# Patient Record
Sex: Female | Born: 1970 | Race: White | Hispanic: No | Smoking: Current every day smoker
Health system: Southern US, Community
[De-identification: ages and names within clinical notes are randomized; demographics above are authoritative.]

## PROBLEM LIST (undated history)

## (undated) DIAGNOSIS — F410 Panic disorder [episodic paroxysmal anxiety] without agoraphobia: Secondary | ICD-10-CM

## (undated) HISTORY — PX: CHOLECYSTECTOMY: SHX55

---

## 2002-01-30 ENCOUNTER — Emergency Department (HOSPITAL_COMMUNITY): Admission: EM | Admit: 2002-01-30 | Discharge: 2002-01-30 | Payer: Self-pay | Admitting: Emergency Medicine

## 2005-10-29 ENCOUNTER — Other Ambulatory Visit: Admission: RE | Admit: 2005-10-29 | Discharge: 2005-10-29 | Payer: Self-pay | Admitting: Obstetrics and Gynecology

## 2006-01-28 ENCOUNTER — Ambulatory Visit (HOSPITAL_COMMUNITY): Admission: RE | Admit: 2006-01-28 | Discharge: 2006-01-28 | Payer: Self-pay | Admitting: Urology

## 2006-07-26 ENCOUNTER — Encounter (INDEPENDENT_AMBULATORY_CARE_PROVIDER_SITE_OTHER): Payer: Self-pay | Admitting: Obstetrics and Gynecology

## 2006-07-26 ENCOUNTER — Ambulatory Visit (HOSPITAL_COMMUNITY): Admission: RE | Admit: 2006-07-26 | Discharge: 2006-07-26 | Payer: Self-pay | Admitting: Obstetrics and Gynecology

## 2007-03-13 ENCOUNTER — Ambulatory Visit (HOSPITAL_COMMUNITY): Payer: Self-pay | Admitting: Marriage and Family Therapist

## 2007-03-26 ENCOUNTER — Ambulatory Visit (HOSPITAL_COMMUNITY): Payer: Self-pay | Admitting: Marriage and Family Therapist

## 2008-05-14 IMAGING — CT CT ABD-PELV W/O CM
3 of 8 series · 11 of 42 positions shown, 17 images · IV contrast (CONTRAST)
Comparison: CT of the abdomen and pelvis done 08/01/05.

PATIENT NAME:  TIGER, EBADAT
DOB:  [birth year]MRN:  [id number]    

REFERRING PHYSICIAN:  DR MMLOTSANE KENAAPE
READ BY:  [REDACTED]al Data: Low back pain radiating to the right hip for 6-8 weeks.  History of breast cancer.  
MRI LUMBAR SPINE WITHOUT CONTRAST:
TECHNIQUE: Multiplanar and multiecho pulse sequences of the lumbar spine, to include the lower thoracic and upper sacral regions, were obtained according to standard protocol without IV contrast.

[Series 4: venous · axial · portal-venous · 0.68mm/px · z∈[+1110,+1350]mm · 4 of 80 slices shown, 9 images]
[im 16/80  soft-tissue]
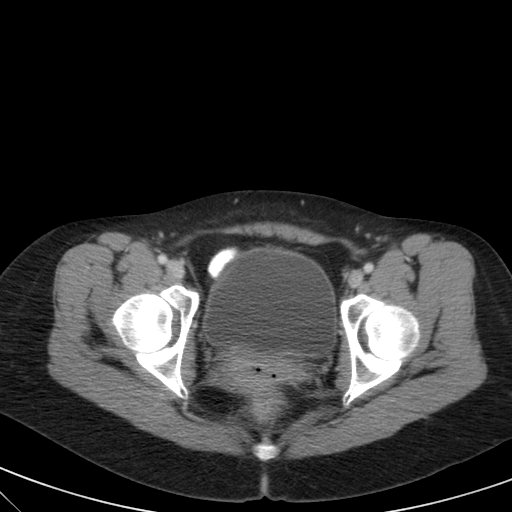
[im 16/80  lung]
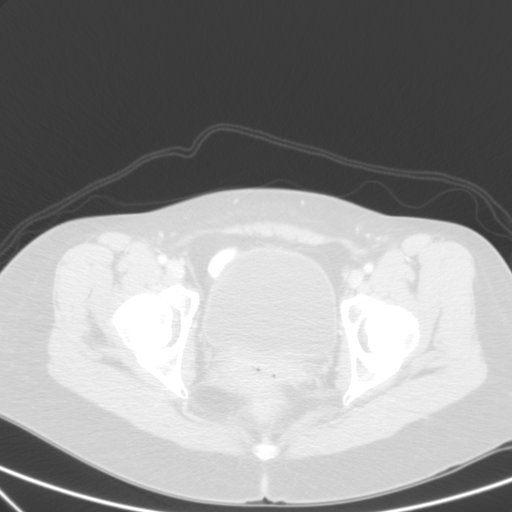
[im 16/80  bone]
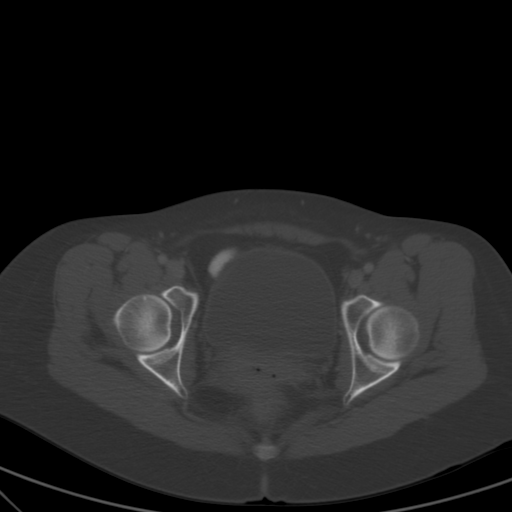
[im 32/80  soft-tissue]
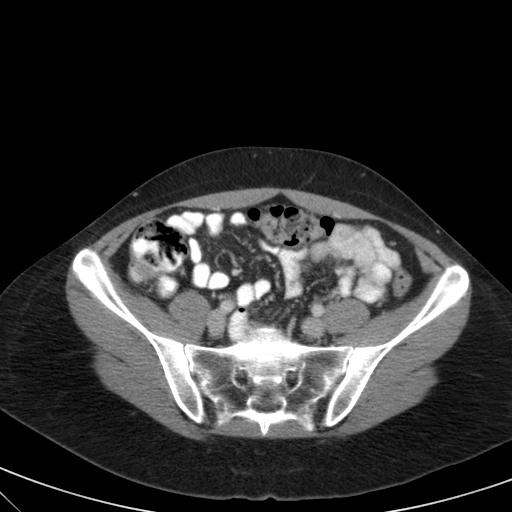
[im 32/80  lung]
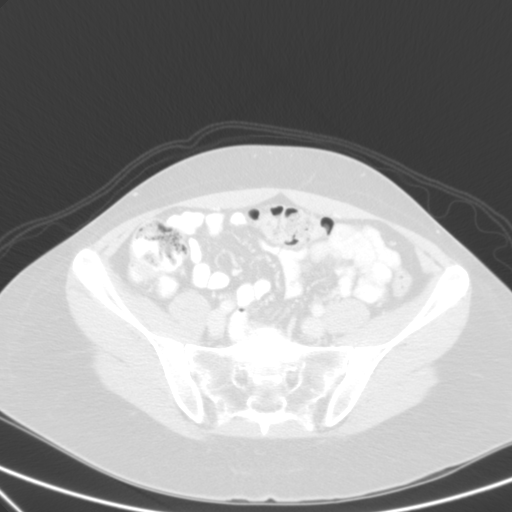
[im 48/80  soft-tissue]
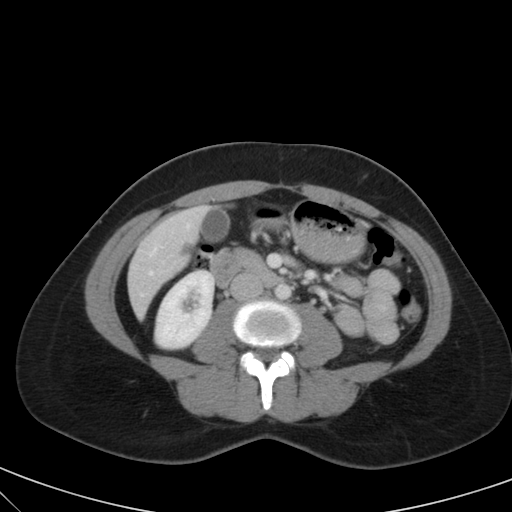
[im 48/80  lung]
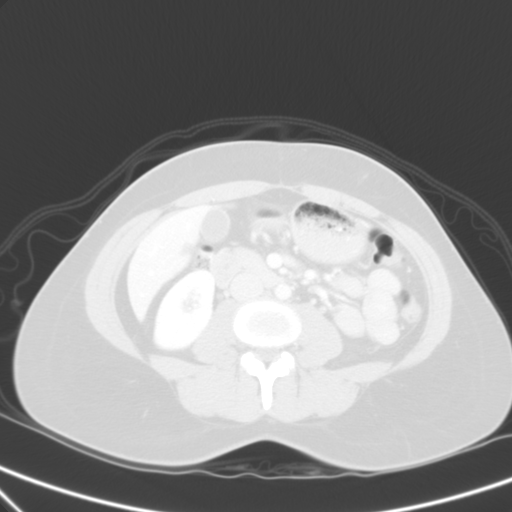
[im 64/80  soft-tissue]
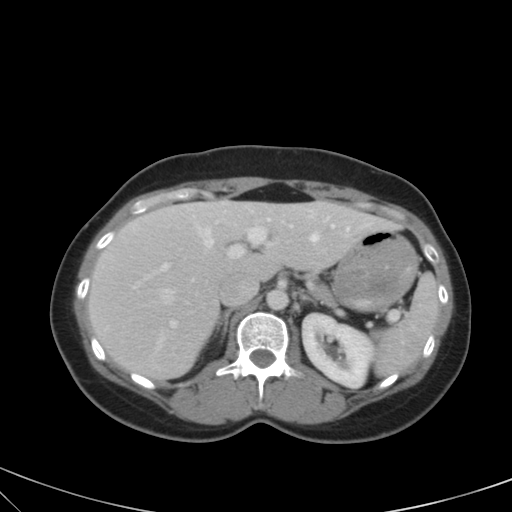
[im 64/80  lung]
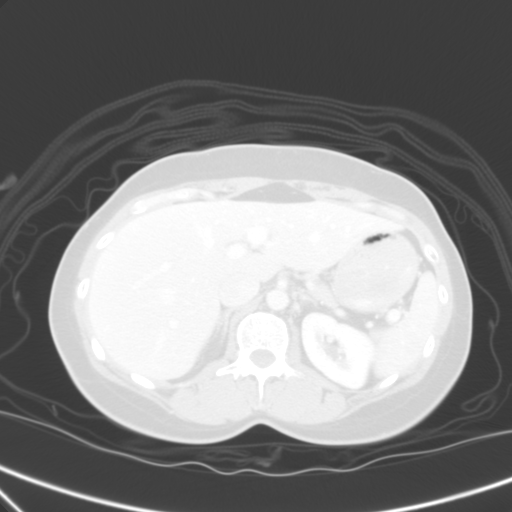

[Series 9: bladder delays · axial · 0.68mm/px · z∈[+1109,+1344]mm · 4 of 79 slices shown]
[im 16/79  soft-tissue]
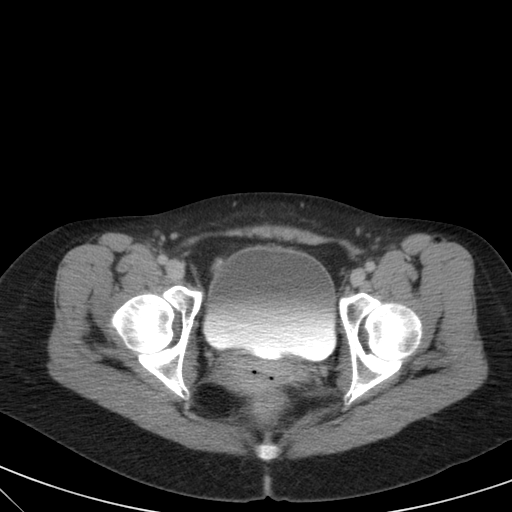
[im 32/79  soft-tissue]
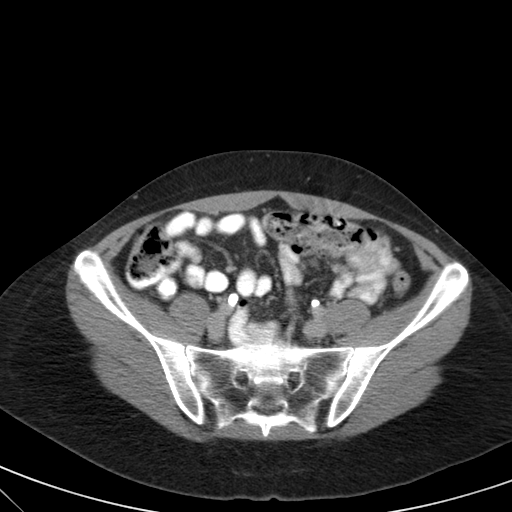
[im 47/79  soft-tissue]
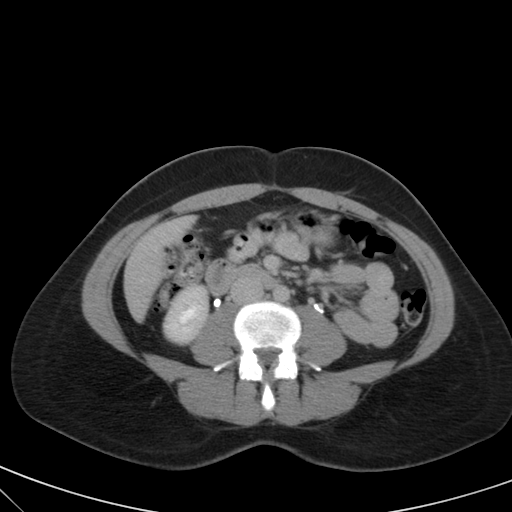
[im 63/79  soft-tissue]
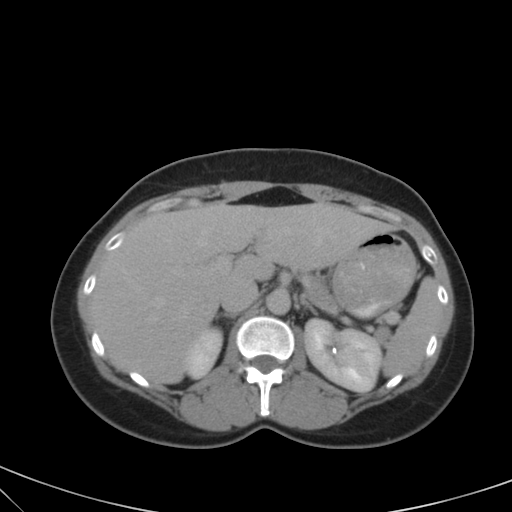

[coronals · coronal · 0.77mm/px · 3 of 54 slices shown, 4 images]
[im 18/54  soft-tissue]
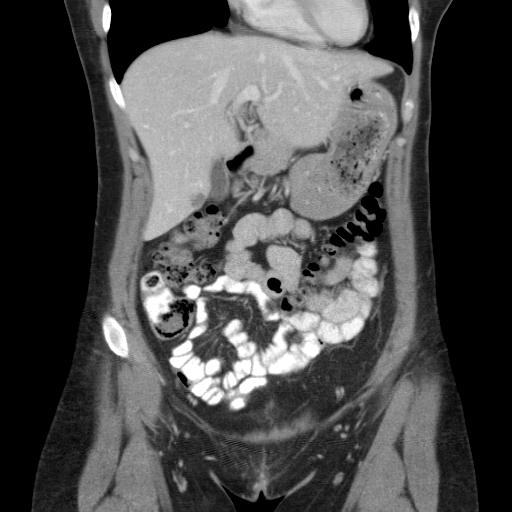
[im 24/54  soft-tissue]
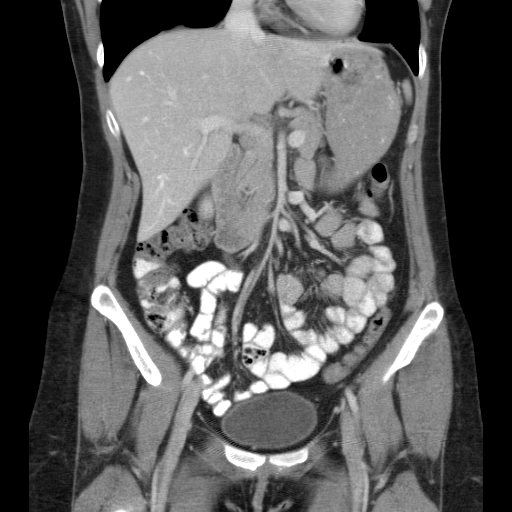
[im 24/54  bone]
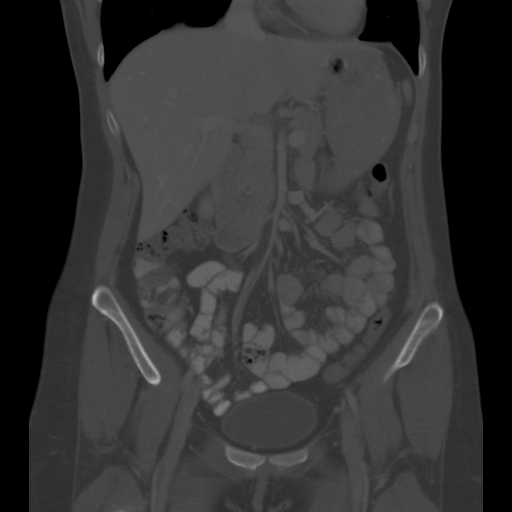
[im 30/54  soft-tissue]
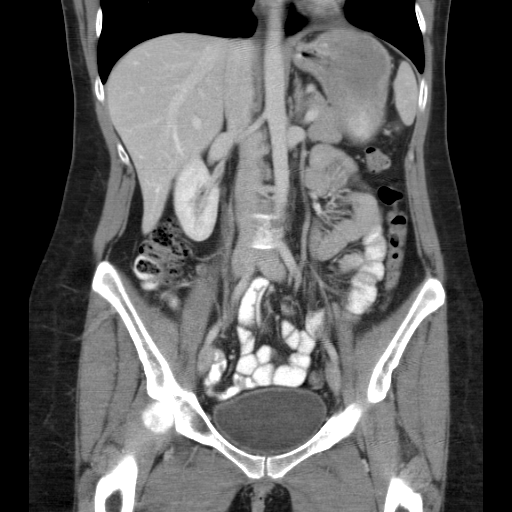

[11 of 42 positions shown; findings below may reference images not displayed]

FINDINGS: Five lumbar type vertebral bodies demonstrate a convex left scoliosis.  The sagittal alignment is anatomic.  There is no evidence of fracture or metastatic disease.  The conus medullaris appears normal, extending to the upper L1 level.

No paraspinal abnormalities are demonstrated.  Sagittal imaging demonstrates no disk space findings from T11-12 through L1-2.

L2-3:  Annular disk bulging and mild bilateral facet hypertrophy.  No spinal stenosis or nerve root encroachment.

L3-4:  Mild disk bulge and mild bilateral facet hypertrophy.  No spinal stenosis or nerve root encroachment.  

L4-5:  Annular disk bulging and bilateral facet hypertrophy contribute to mild right foraminal and left greater than right lateral recess stenosis.  Left L5 nerve root encroachment is possible.  No right sided nerve root compression is seen.

L5-S1:  A left paracentral disk extrusion demonstrates cranial extension, but is largely contained within the ventral epidural fat.  No mass effect on the L5 or S1 nerve roots is demonstrated.  There is asymmetric left sided facet hypertrophy, but no significant foraminal compromise.
                 CONTINUED ON PAGE TWO... WBV/[REDACTED]

PATIENT NAME:  VINNY GULUVA
DOB:  [birth year]MRN:  [id number]    

PAGE TWO?
IMPRESSION: 1.A specific cause for the patient's right hip pain is not demonstrated by this examination.  At L4-5, there is mild right foraminal and right lateral recess stenosis due to annular disk bulging and bilateral facet hypertrophy.  The left lateral recess stenosis appears more significant at that level.
2.Small left paracentral disk extrusion at L5-S1 without demonstrated resulting spinal stenosis or nerve root encroachment.
3.Lesser disk bulging and facet disease at L2-3 and L3-4 without resulting spinal stenosis.

[REDACTED]SCRIBED: 11-12-2005 [DATE]
APPROVED: TIGER, EBADAT 11-12-2005 [DATE]

WBV/[REDACTED]

## 2008-05-23 ENCOUNTER — Emergency Department (HOSPITAL_COMMUNITY): Admission: EM | Admit: 2008-05-23 | Discharge: 2008-05-23 | Payer: Self-pay | Admitting: Emergency Medicine

## 2008-06-01 ENCOUNTER — Ambulatory Visit (HOSPITAL_COMMUNITY): Payer: Self-pay | Admitting: Marriage and Family Therapist

## 2008-06-10 ENCOUNTER — Ambulatory Visit (HOSPITAL_COMMUNITY): Payer: Self-pay | Admitting: Marriage and Family Therapist

## 2008-06-17 ENCOUNTER — Ambulatory Visit (HOSPITAL_COMMUNITY): Payer: Self-pay | Admitting: Marriage and Family Therapist

## 2008-06-22 ENCOUNTER — Ambulatory Visit (HOSPITAL_COMMUNITY): Payer: Self-pay | Admitting: Licensed Clinical Social Worker

## 2010-04-18 LAB — DIFFERENTIAL
Basophils Absolute: 0 10*3/uL (ref 0.0–0.1)
Basophils Relative: 1 % (ref 0–1)
Eosinophils Absolute: 0.1 10*3/uL (ref 0.0–0.7)
Eosinophils Relative: 1 % (ref 0–5)
Lymphocytes Relative: 38 % (ref 12–46)
Lymphs Abs: 2.5 10*3/uL (ref 0.7–4.0)
Monocytes Absolute: 0.6 10*3/uL (ref 0.1–1.0)
Monocytes Relative: 9 % (ref 3–12)

## 2010-04-18 LAB — CBC
Platelets: 284 10*3/uL (ref 150–400)
RDW: 13.1 % (ref 11.5–15.5)

## 2010-04-18 LAB — ETHANOL

## 2010-04-18 LAB — BASIC METABOLIC PANEL
BUN: 7 mg/dL (ref 6–23)
CO2: 27 mEq/L (ref 19–32)
Calcium: 9.1 mg/dL (ref 8.4–10.5)
Chloride: 109 mEq/L (ref 96–112)
Potassium: 3.3 mEq/L — ABNORMAL LOW (ref 3.5–5.1)
Sodium: 141 mEq/L (ref 135–145)

## 2010-04-18 LAB — RAPID URINE DRUG SCREEN, HOSP PERFORMED
Barbiturates: NOT DETECTED
Benzodiazepines: NOT DETECTED
Opiates: NOT DETECTED

## 2010-05-23 NOTE — Op Note (Signed)
NAMETYMIA, STREB                   ACCOUNT NO.:  [id number]   MEDICAL RECORD NO.:  [id number]          PATIENT TYPE:  AMB   LOCATION:  SDC                           FACILITY:  [facility]   PHYSICIAN:  Naima A. Dillard, M.D. DATE OF BIRTH:  May 24, 1970   DATE OF PROCEDURE:  07/26/2006  DATE OF DISCHARGE:                               OPERATIVE REPORT   PREOPERATIVE DIAGNOSIS:  Irregular vaginal bleeding and endometrial  polyps.   POSTOPERATIVE DIAGNOSIS:  Irregular vaginal bleeding and endometrial  polyps.   PROCEDURE:  D&C hysteroscopy, polypectomy.   SURGEON:  Dr. Normand Sloop.  There were no assistants.   ANESTHESIA:  General laryngeal mask airway.   SPECIMENS:  Endometrial polyp and endometrial curettings.   COMPLICATIONS:  None.   There was a 60 mL deficit.  Most of it appeared to be on the floor.  The  patient went to PACU in stable condition.   PROCEDURE IN DETAIL:  The patient was taken to the operating room where  she was given general anesthesia, placed in dorsal lithotomy position  and prepped and draped in a normal sterile fashion.  Exam under  anesthesia revealed the patient to be anteverted, normal size uterus  with no adnexal masses.  A bivalve speculum was placed into the vagina.  The edge of the cervix was grasped with a single-tooth tenaculum.  Twenty mL of 1% lidocaine was used for a cervical block.  Cervix was  dilated with Pratt dilators up to 19, and hysteroscope was placed into  the uterine cavity.  Both ostia were visualized on the side walls;  anterior and posterior side walls of the uterine cavity were visualized.  The patient had one polyp that was posterior in the canal and then just  fluffy endometrium.  The hysteroscope was removed.  Polyp forceps were  placed into the uterine cavity, and most of the polyp was removed.  The  rest was removed with a polyp forceps through the hysteroscope.  A sharp  curette was done.  The hysteroscope was placed back in the  uterine  cavity.  The polyp was removed without difficulty.  Scant endometrial  curettings were obtained, but there was no more fluffy endometrium noted  at the end of the procedure.  Sponge, lap and needle counts were  correct.  She was removed from the vagina.  Sponge, lap and needle  counts were correct.  The tenaculum site was made hemostatic with  pressure and silver nitrate.      Naima A. Normand Sloop, M.D.  Electronically Signed     NAD/MEDQ  D:  07/26/2006  T:  07/27/2006  Job:  [id number]

## 2010-10-23 LAB — CBC
HCT: 39.3
MCV: 92.8
RBC: 4.24
WBC: 7.4

## 2010-10-23 LAB — HCG, SERUM, QUALITATIVE: Preg, Serum: NEGATIVE

## 2014-07-25 ENCOUNTER — Encounter (HOSPITAL_COMMUNITY): Payer: Self-pay

## 2014-07-25 ENCOUNTER — Emergency Department (HOSPITAL_COMMUNITY)
Admission: EM | Admit: 2014-07-25 | Discharge: 2014-07-25 | Disposition: A | Discharge status: Home / Self Care | Payer: Self-pay | Attending: Emergency Medicine | Admitting: Emergency Medicine

## 2014-07-25 DIAGNOSIS — R202 Paresthesia of skin: Secondary | ICD-10-CM | POA: Insufficient documentation

## 2014-07-25 DIAGNOSIS — Z79899 Other long term (current) drug therapy: Secondary | ICD-10-CM | POA: Insufficient documentation

## 2014-07-25 DIAGNOSIS — Z7982 Long term (current) use of aspirin: Secondary | ICD-10-CM | POA: Insufficient documentation

## 2014-07-25 DIAGNOSIS — R51 Headache: Secondary | ICD-10-CM | POA: Insufficient documentation

## 2014-07-25 DIAGNOSIS — F410 Panic disorder [episodic paroxysmal anxiety] without agoraphobia: Secondary | ICD-10-CM | POA: Insufficient documentation

## 2014-07-25 DIAGNOSIS — R0602 Shortness of breath: Secondary | ICD-10-CM | POA: Insufficient documentation

## 2014-07-25 DIAGNOSIS — Z3202 Encounter for pregnancy test, result negative: Secondary | ICD-10-CM | POA: Insufficient documentation

## 2014-07-25 DIAGNOSIS — Z720 Tobacco use: Secondary | ICD-10-CM | POA: Insufficient documentation

## 2014-07-25 HISTORY — DX: Panic disorder (episodic paroxysmal anxiety): F41.0

## 2014-07-25 LAB — DIFFERENTIAL
Basophils Absolute: 0 10*3/uL (ref 0.0–0.1)
Basophils Relative: 1 % (ref 0–1)
EOS ABS: 0.1 10*3/uL (ref 0.0–0.7)
EOS PCT: 1 % (ref 0–5)
LYMPHS ABS: 2.4 10*3/uL (ref 0.7–4.0)
Lymphocytes Relative: 32 % (ref 12–46)
Monocytes Absolute: 0.5 10*3/uL (ref 0.1–1.0)
Monocytes Relative: 6 % (ref 3–12)
Neutro Abs: 4.6 10*3/uL (ref 1.7–7.7)
Neutrophils Relative %: 60 % (ref 43–77)

## 2014-07-25 LAB — I-STAT CHEM 8, ED
BUN: 16 mg/dL (ref 6–20)
CALCIUM ION: 1.11 mmol/L — AB (ref 1.12–1.23)
CHLORIDE: 105 mmol/L (ref 101–111)
CREATININE: 1 mg/dL (ref 0.44–1.00)
Glucose, Bld: 90 mg/dL (ref 65–99)
HCT: 45 % (ref 36.0–46.0)
Hemoglobin: 15.3 g/dL — ABNORMAL HIGH (ref 12.0–15.0)
Potassium: 3.9 mmol/L (ref 3.5–5.1)
SODIUM: 139 mmol/L (ref 135–145)
TCO2: 21 mmol/L (ref 0–100)

## 2014-07-25 LAB — APTT: APTT: 29 s (ref 24–37)

## 2014-07-25 LAB — COMPREHENSIVE METABOLIC PANEL
ALK PHOS: 64 U/L (ref 38–126)
ALT: 15 U/L (ref 14–54)
AST: 20 U/L (ref 15–41)
Albumin: 4.3 g/dL (ref 3.5–5.0)
Anion gap: 11 (ref 5–15)
BUN: 13 mg/dL (ref 6–20)
CO2: 22 mmol/L (ref 22–32)
Calcium: 9.6 mg/dL (ref 8.9–10.3)
Chloride: 104 mmol/L (ref 101–111)
Creatinine, Ser: 0.92 mg/dL (ref 0.44–1.00)
GLUCOSE: 92 mg/dL (ref 65–99)
POTASSIUM: 4 mmol/L (ref 3.5–5.1)
Sodium: 137 mmol/L (ref 135–145)
TOTAL PROTEIN: 7.3 g/dL (ref 6.5–8.1)
Total Bilirubin: 0.5 mg/dL (ref 0.3–1.2)

## 2014-07-25 LAB — URINALYSIS, ROUTINE W REFLEX MICROSCOPIC
Bilirubin Urine: NEGATIVE
GLUCOSE, UA: NEGATIVE mg/dL
KETONES UR: NEGATIVE mg/dL
LEUKOCYTES UA: NEGATIVE
Nitrite: NEGATIVE
PROTEIN: NEGATIVE mg/dL
Specific Gravity, Urine: 1.003 — ABNORMAL LOW (ref 1.005–1.030)
Urobilinogen, UA: 0.2 mg/dL (ref 0.0–1.0)
pH: 6.5 (ref 5.0–8.0)

## 2014-07-25 LAB — CBC
HEMATOCRIT: 40.7 % (ref 36.0–46.0)
HEMOGLOBIN: 13.8 g/dL (ref 12.0–15.0)
MCH: 31 pg (ref 26.0–34.0)
MCHC: 33.9 g/dL (ref 30.0–36.0)
MCV: 91.5 fL (ref 78.0–100.0)
Platelets: 318 10*3/uL (ref 150–400)
RBC: 4.45 MIL/uL (ref 3.87–5.11)
RDW: 12.6 % (ref 11.5–15.5)
WBC: 7.7 10*3/uL (ref 4.0–10.5)

## 2014-07-25 LAB — I-STAT TROPONIN, ED: Troponin i, poc: 0 ng/mL (ref 0.00–0.08)

## 2014-07-25 LAB — PROTIME-INR
INR: 0.99 (ref 0.00–1.49)
PROTHROMBIN TIME: 13.3 s (ref 11.6–15.2)

## 2014-07-25 LAB — URINE MICROSCOPIC-ADD ON: RBC / HPF: NONE SEEN RBC/hpf (ref <3)

## 2014-07-25 LAB — CBG MONITORING, ED: Glucose-Capillary: 89 mg/dL (ref 65–99)

## 2014-07-25 LAB — PREGNANCY, URINE: PREG TEST UR: NEGATIVE

## 2014-07-25 NOTE — ED Provider Notes (Addendum)
Pt seen and evaluated.  Reports anxiety, rue "tingling", and fleeting, mild lt posterior headache after drying her hair.  Shows no cardiac risk factors. No history of migraines. Asymptomatic now. Has had anxiety episodes with tingling in her left arm. Has normal neurological exam. Normal EKG. No cardiac risk. She is  appropriate for expectant management and home discharge.  Rolland PorterMark Sharika Mosquera, MD 07/25/14 1523  Rolland PorterMark Elvi Leventhal, MD 07/26/14 [phone]

## 2014-07-25 NOTE — ED Provider Notes (Signed)
CSN: [id number]     Arrival date & time 07/25/14  1251 History   First MD Initiated Contact with Patient 07/25/14 1319     Chief Complaint  Patient presents with  . Numbness     (Consider location/radiation/quality/duration/timing/severity/associated sxs/prior Treatment) HPI Erica Marsh is a 44 year old female past medical history of panic attacks who presents the ER complaining of tingling sensation in her right arm. Patient reports she was blow drying her hair at approximately 12:30 this afternoon, approximately 2 hours ago. Patient reports a sudden onset of a mild headache along with a tingling sensation diffusely down her arm. Patient reports associated shortness of breath which she states is consistent with an identical to anxiety she has experienced in the past. Patient states since this began her Tinley sensation has slightly decreased and subsided, however still present. Patient also reports an associated heaviness in her right arm. Patient denies any persistent headache, change or blurred vision, dizziness, syncope, neck pain, fever, chest pain, nausea, vomiting, abdominal pain, dysuria, diarrhea.  Past Medical History  Diagnosis Date  . Panic attacks    Past Surgical History  Procedure Laterality Date  . Cholecystectomy     No family history on file. History  Substance Use Topics  . Smoking status: Current Every Day Smoker  . Smokeless tobacco: Not on file  . Alcohol Use: No   OB History    No data available     Review of Systems  Constitutional: Negative for fever.  HENT: Negative for trouble swallowing.   Eyes: Negative for visual disturbance.  Respiratory: Negative for shortness of breath.   Cardiovascular: Negative for chest pain.  Gastrointestinal: Negative for nausea, vomiting and abdominal pain.  Genitourinary: Negative for dysuria.  Musculoskeletal: Negative for neck pain.  Skin: Negative for rash.  Neurological: Negative for dizziness, weakness and numbness.        Paresthesias  Psychiatric/Behavioral: Negative.       Allergies  Review of patient's allergies indicates no known allergies.  Home Medications   Prior to Admission medications   Medication Sig Start Date End Date Taking? Authorizing Provider  Aspirin-Acetaminophen-Caffeine (GOODY HEADACHE PO) Take 1 packet by mouth once.   Yes Historical Provider, MD  buPROPion (WELLBUTRIN XL) 150 MG 24 hr tablet Take 150 mg by mouth daily.   Yes Historical Provider, MD  [facility] WORT PO Take 2 tablets by mouth once.   Yes Historical Provider, MD   BP 98/67 mmHg  Pulse 85  Temp(Src) 98.3 F (36.8 C) (Oral)  Resp 20  SpO2 100%  LMP  Physical Exam  Constitutional: She is oriented to person, place, and time. She appears well-developed and well-nourished. No distress.  HENT:  Head: Normocephalic and atraumatic.  Mouth/Throat: Oropharynx is clear and moist. No oropharyngeal exudate.  Eyes: Right eye exhibits no discharge. Left eye exhibits no discharge. No scleral icterus.  Neck: Normal range of motion.  Cardiovascular: Normal rate, regular rhythm and normal heart sounds.   No murmur heard. Pulmonary/Chest: Effort normal and breath sounds normal. No respiratory distress.  Abdominal: Soft. There is no tenderness.  Musculoskeletal: Normal range of motion. She exhibits no edema or tenderness.  Neurological: She is alert and oriented to person, place, and time. She has normal strength. No cranial nerve deficit or sensory deficit. She displays a negative Romberg sign. Coordination and gait normal. GCS eye subscore is 4. GCS verbal subscore is 5. GCS motor subscore is 6.  Patient fully alert, answering questions appropriately in full, clear  sentences. Cranial nerves II through XII grossly intact. Motor strength 5 out of 5 in all major muscle groups of upper and lower extremities. Distal sensation intact.   Skin: Skin is warm and dry. No rash noted. She is not diaphoretic.  Psychiatric: She has a  normal mood and affect.  Nursing note and vitals reviewed.   ED Course  Procedures (including critical care time) Labs Review Labs Reviewed  URINALYSIS, ROUTINE W REFLEX MICROSCOPIC (NOT AT [facility]) - Abnormal; Notable for the following:    Specific Gravity, Urine 1.003 (*)    Hgb urine dipstick SMALL (*)    All other components within normal limits  I-STAT CHEM 8, ED - Abnormal; Notable for the following:    Calcium, Ion 1.11 (*)    Hemoglobin 15.3 (*)    All other components within normal limits  PROTIME-INR  APTT  CBC  DIFFERENTIAL  COMPREHENSIVE METABOLIC PANEL  PREGNANCY, URINE  URINE MICROSCOPIC-ADD ON  CBG MONITORING, ED  I-STAT TROPOININ, ED    Imaging Review No results found.   EKG Interpretation None      MDM   Final diagnoses:  Paresthesia of right arm    Patient's signs and symptoms of paresthesias in right arm, after putting arm down after drying her hair. The symptoms seem to be positional. There is a component of anxiety as patient is very anxious initially. As patient's anxiety subsides, patient's paresthesias also subsided during her stay here. After workup here which is largely unremarkable, tingling sensation is almost completely subsided. She states she has some mild tingling sensation in her fingertips. Neurologic exam remains benign after multiple examinations. Laboratories unremarkable for acute pathology. Heart score 0. Troponin negative, no concern for ACS. EKG without evidence of injury or ectopy. Patient is afebrile, hemodynamic stable and in no acute distress. Patient is neurologically intact. Patient stable for discharge. Encouraged follow up with PCP, return precautions discussed. Patient verbalizes understanding and agreement of this plan.  BP 98/67 mmHg  Pulse 85  Temp(Src) 98.3 F (36.8 C) (Oral)  Resp 20  SpO2 100%  LMP   Signed,  Ladona Mow, PA-C 4:38 PM  Patient seen and discussed with Dr. Rolland Porter, MD    Ladona Mow,  PA-C 07/25/14 1638  Rolland Porter, MD 07/26/14 2205

## 2014-07-25 NOTE — ED Notes (Signed)
Onset 20 minutes PTA pt was blow drying hair and started having tingling in right arm and hand  Grips equal, smile symmetrical, no gait disturbances.  Pt reports that when she has panic attacks left arm is the one that starts tingling.

## 2014-07-25 NOTE — ED Notes (Signed)
States was drying hair at 12:30-- felt right arm start tingling and heavy. Thought it would go away-- also states has drank 1 cup of coffee, taken a goodie powder, felt "a little weird" thought that would pass. Still having arm numbness.

## 2014-07-25 NOTE — ED Notes (Signed)
pts CBG 89

## 2014-07-25 NOTE — Discharge Instructions (Signed)
Paresthesia °Paresthesia is an abnormal burning or prickling sensation. This sensation is generally felt in the hands, arms, legs, or feet. However, it may occur in any part of the body. It is usually not painful. The feeling may be described as: °· Tingling or numbness. °· "Pins and needles." °· Skin crawling. °· Buzzing. °· Limbs "falling asleep." °· Itching. °Most people experience temporary (transient) paresthesia at some time in their lives. °CAUSES  °Paresthesia may occur when you breathe too quickly (hyperventilation). It can also occur without any apparent cause. Commonly, paresthesia occurs when pressure is placed on a nerve. The feeling quickly goes away once the pressure is removed. For some people, however, paresthesia is a long-lasting (chronic) condition caused by an underlying disorder. The underlying disorder may be: °· A traumatic, direct injury to nerves. Examples include a: °¨ Broken (fractured) neck. °¨ Fractured skull. °· A disorder affecting the brain and spinal cord (central nervous system). Examples include: °¨ Transverse myelitis. °¨ Encephalitis. °¨ Transient ischemic attack. °¨ Multiple sclerosis. °¨ Stroke. °¨ Tumor or blood vessel problems, such as an arteriovenous malformation pressing against the brain or spinal cord. °· A condition that damages the peripheral nerves (peripheral neuropathy). Peripheral nerves are not part of the brain and spinal cord. These conditions include: °¨ Diabetes. °¨ Peripheral vascular disease. °¨ Nerve entrapment syndromes, such as carpal tunnel syndrome. °¨ Shingles. °¨ Hypothyroidism. °¨ Vitamin B12 deficiencies. °¨ Alcoholism. °¨ Heavy metal poisoning (lead, arsenic). °¨ Rheumatoid arthritis. °¨ Systemic lupus erythematosus. °DIAGNOSIS  °Your caregiver will attempt to find the underlying cause of your paresthesia. Your caregiver may: °· Take your medical history. °· Perform a physical exam. °· Order various lab tests. °· Order imaging tests. °TREATMENT    °Treatment for paresthesia depends on the underlying cause. °HOME CARE INSTRUCTIONS °· Avoid drinking alcohol. °· You may consider massage or acupuncture to help relieve your symptoms. °· Keep all follow-up appointments as directed by your caregiver. °SEEK IMMEDIATE MEDICAL CARE IF:  °· You feel weak. °· You have trouble walking or moving. °· You have problems with speech or vision. °· You feel confused. °· You cannot control your bladder or bowel movements. °· You feel numbness after an injury. °· You faint. °· Your burning or prickling feeling gets worse when walking. °· You have pain, cramps, or dizziness. °· You develop a rash. °MAKE SURE YOU: °· Understand these instructions. °· Will watch your condition. °· Will get help right away if you are not doing well or get worse. °Document Released: 12/15/2001 Document Revised: 03/19/2011 Document Reviewed: 09/15/2010 °ExitCare® Patient Information ©2015 [employer]. This information is not intended to replace advice given to you by your health care provider. Make sure you discuss any questions you have with your health care provider. ° ° °Emergency Department Resource Guide °1) Find a Doctor and Pay Out of Pocket °Although you won't have to find out who is covered by your insurance plan, it is a good idea to ask around and get recommendations. You will then need to call the office and see if the doctor you have chosen will accept you as a new patient and what types of options they offer for patients who are self-pay. Some doctors offer discounts or will set up payment plans for their patients who do not have insurance, but you will need to ask so you aren't surprised when you get to your appointment. ° °2) Contact Your Local Health Department °Not all health departments have doctors that can see   patients for sick visits, but many do, so it is worth a call to see if yours does. If you don't know where your local health department is, you can check in your phone  book. The CDC also has a tool to help you locate your state's health department, and many state websites also have listings of all of their local health departments. ° °3) Find a Walk-in Clinic °If your illness is not likely to be very severe or complicated, you may want to try a walk in clinic. These are popping up all over the country in pharmacies, drugstores, and shopping centers. They're usually staffed by nurse practitioners or physician assistants that have been trained to treat common illnesses and complaints. They're usually fairly quick and inexpensive. However, if you have serious medical issues or chronic medical problems, these are probably not your best option. ° °No Primary Care Doctor: °- Call [facility] at  832-8000 - they can help you locate a primary care doctor that  accepts your insurance, provides certain services, etc. °- Physician Referral Service- 1-800-533-3463 ° °Chronic Pain Problems: °Organization         Address  Phone   Notes  °[facility]  (336) 297-2271 Patients need to be referred by their primary care doctor.  ° °Medication Assistance: °Organization         Address  Phone   Notes  °[place] [facility] [address] °[place], [place] [address] (336) 641-8030 --Must be a resident of [place] °-- Must have NO insurance coverage whatsoever (no Medicaid/ Medicare, etc.) °-- The pt. MUST have a primary care doctor that directs their care regularly and follows them in the community °  °[facility]  (866) 331-1348   °[employer]  (888) 892-1162   ° °Agencies that provide inexpensive medical care: °Organization         Address  Phone   Notes  °[facility]  (336) 832-8035   °[facility]    (336) 832-7272   °[facility] [address] °[place], [place] [address] (336) 832-4777   °[facility] [address], °[place] (336) 271-4999   °[facility]    (336) 373-0678   °[facility]    (336) 272-1050   °[facility] ° [address], [place] Phone:  (336) 832-4444, Fax:  (336) 832-4440 Hours of Operation:  9 am - 6 pm, M-F.  Also accepts Medicaid/Medicare and self-pay.  °[facility] ° [address], [place] Phone: (336) 832-3150, Fax: (336) 832-3151. Hours of Operation:  8:30 am - 5:30 pm, M-F.  Also accepts Medicaid and self-pay.  °[facility] [place] [address], [place] Phone: (336) 878-6027   °[facility] [address], [place], [place] (336)723-1848, Ext. 123 Mondays & Thursdays: 7-9 AM.  First 15 patients are seen on a first come, first serve basis. °  ° °Medicaid-accepting [place] Providers: ° °Organization         Address  Phone   Notes  °[facility] [address], [place] (336) 641-2100 Also accepts self-pay patients.  °[facility] [address], [place] ° (336) 856-9996   °[facility] [address],  (336) 288-8857   °[facility] [address] [place] [address],  (  336) 299-7000   °Veita Bland [address], [place]  ° (336) 373-1557 Only accepts [place] Access Medicaid patients after they have their name applied to their card.  ° °Self-Pay (no insurance) in [place]: ° °Organization         Address  Phone   Notes  °Sickle Cell Patients, [facility] [address], [place] (336) 832-1970   °[facility] [address], [place] (336) 832-4400   °[facility] ° [address], [place] (336) 992-4800   °[facility]/Dr. Osei-Bonsu ° [address] [place] [address], [place] or [address], [place] (336) 841-8500 Phone number for both [place] and [place] locations is the same.   °[facility] [address], [place] (336) 299-0000   °[facility] [address] [place] [address], [place] or [address] (336) 852-7530 °(336) 878-2260   °[facility] [address], [place] (336) 350-1642, phone; (336) 294-5005, fax Sees patients 1st and 3rd Saturday of every month.  Must not qualify for public or private insurance (i.e. Medicaid, Medicare, [place] Health Choice, Veterans' Benefits) • Household income should be no more than 200% of the poverty level •The clinic cannot treat you if you are pregnant or think you are pregnant • Sexually transmitted diseases are not treated at the clinic.  ° ° °Dental Care: °Organization         Address  Phone  Notes  °[facility] [address], [place] (336) 641-6152 Accepts children up to age 21 who are enrolled in Medicaid or [place] Health Choice; pregnant women with a Medicaid card; and children who have applied for Medicaid or [place] Health Choice, but were declined, whose parents can pay a reduced fee at time of service.  °[facility] [place]  [address], [place] (336) 641-7733 Accepts children up to age 21 who are enrolled in Medicaid or [place] Health Choice; pregnant women with a Medicaid card; and children who have applied for Medicaid or  Health Choice, but were declined, whose parents can pay a reduced fee at time of service.  °[facility] ° [address], [place] (336) 641-4533 Patients are seen by appointment only. Walk-ins are not accepted. [facility] will see patients 18 years of age and older. °Monday - Tuesday (8am-5pm) °Most Wednesdays (8:30-5pm) °$30 per visit, cash only  °[facility] ° [address], [place] (336) 641-4533 Patients are seen by appointment only. Walk-ins are not accepted. [facility] will see patients 18  years of age and older. °One Wednesday Evening (Monthly: Volunteer Based).  $30 per visit, cash only  °[facility]  (919) 537-3737 for adults; Children under age 4, call Graduate Pediatric Dentistry at (919) [phone]. Children aged 4-14, please call (919) [phone] to request a pediatric application. ° Dental services are provided in all areas of dental care including fillings, crowns and bridges, complete and partial dentures, implants, gum treatment, root canals, and extractions. Preventive care is also provided. Treatment is provided to both adults and children. °Patients are selected via a lottery and there is often a waiting list. °  °[facility] [address], °[place] ° (336) 763-8833 [email or web address] °  °[facility] [address], [place], [place] (336)723-1848, Ext. 123 Second and Fourth Thursday of each month, opens at 6:30 AM; Clinic ends at 9 AM.  Patients are seen on a first-come first-served basis, and a limited number are seen during each clinic.  ° °[facility] ° [address], [place], [place] (336) 723-7904   Eligibility Requirements °You must have lived in [place], [place], or [place] counties for at least the last three months. °  You cannot be eligible for state or federal sponsored healthcare insurance, including Veterans Administration, Medicaid, or Medicare. °  You generally cannot be eligible for healthcare insurance through your employer.  °  How to apply: °Eligibility screenings are held every Tuesday and Wednesday afternoon from 1:00 pm until 4:00 pm. You do not need an appointment for the interview!  °[facility] [address], [place], [place] 336-631-2330   °[facility]  336-342-8273   °[facility]  336-703-3100   °[facility]  336-570-6415   ° °Behavioral Health Resources in the Community: °Intensive Outpatient  Programs °Organization         Address  Phone  Notes  °[facility] [address], [place], [place] 336-878-6098   °[facility] [address], [place], [place] 336-832-9800   °[facility] [address], [place], [place] ° 336-882-2125   °[facility] [address],  °[place], [place] 1-800-853-5163 or 336-641-4981   °Substance Abuse Resources °Organization         Address  Phone  Notes  °[facility]  336-882-2125   °[facility]  336-784-9470   °[facility]  336-285-9073   °[facility]  336-845-3988   °Residential & Outpatient Substance Abuse Program  1-800-659-3381   °Psychological Services °Organization         Address  Phone  Notes  °[facility]  336- 832-9600   °[facility]  336- 378-7881   °[facility] [address], [place] 1-800-853-5163 or 336-641-4981   ° °Mobile Crisis Teams °Organization         Address  Phone  Notes  °[facility], Mobile Crisis Care Unit  1-877-626-1772   °Assertive °[facility] ° [address] [place], [place] 336-834-9664   °Sharon DeEsch [address] °[place] 336-554-5454   ° °Self-Help/Support Groups °Organization         Address  Phone             Notes  °[facility] - variety of support groups  336- 373-1402 Call for more information  °Narcotics Anonymous (NA), [facility] [address], °[place]  2 meetings at this location  ° °Residential Treatment Programs °Organization         Address  Phone  Notes  °[facility] [address],    °[place]  1-866-801-8205   °[facility] ° [address], [place], [place] 704-293-8524   °[facility] [address], [place] 336-845-3988 Admissions: 8am-3pm M-F  °[facility] [address]    °[place], [place]  336-841-1104   °[facility] [address], [place], [place] 336-379-7146   °[facility] [address]  °[place], [place] 336-285-9073   °[facility] - Intensive Outpatient [address], ,  336-852-3033   °  [facility] [address]  °[place], [place] 1-877-615-2722 or 336-784-9470   °[facility] ([facility] [address] [place], [place] 336-227-7417 Accepts Medicaid  °[facility] [address]  °[place] 1-800-659-3381 Substance Abuse/Addiction Treatment  ° °[place] Behavioral Health Resources °Organization         Address  Phone  Notes  °[facility]  (888) 581-9988   °Julie Brannon, PhD [address] [place], [place]   (336) 349-5553 or (336) 951-0000   °[facility]   [address] °[place], [place] (336) 349-4454   °[facility] [address], [place], [place] (336) 342-8316 Insurance/Medicaid/sponsorship through [facility]  °[facility] [address]                                    [place], [place] (336) 342-8316 Therapy/tele-psych/case  °[facility] [address]  ° [place], [place] (336) 349-2233    °Dr. Arfeen  (336) 349-4544   °[facility] [place]  [employer] [place] [facility] 1) [address], [place] °2) [address], [place] °3)  [address], [place] (336) 349-3220 °(336) 342-7768 ° °(336) 342-8140   °[place] [facility] (336) 342-1394 or (336) 342-3537 (After Hours)    ° ° ° °

## 2017-02-13 ENCOUNTER — Emergency Department (HOSPITAL_COMMUNITY)
Admission: EM | Admit: 2017-02-13 | Discharge: 2017-02-13 | Disposition: A | Discharge status: Home / Self Care | Payer: BLUE CROSS/BLUE SHIELD | Attending: Emergency Medicine | Admitting: Emergency Medicine

## 2017-02-13 ENCOUNTER — Encounter (HOSPITAL_COMMUNITY): Payer: Self-pay | Admitting: Emergency Medicine

## 2017-02-13 DIAGNOSIS — F17200 Nicotine dependence, unspecified, uncomplicated: Secondary | ICD-10-CM | POA: Insufficient documentation

## 2017-02-13 DIAGNOSIS — J029 Acute pharyngitis, unspecified: Secondary | ICD-10-CM

## 2017-02-13 DIAGNOSIS — R509 Fever, unspecified: Secondary | ICD-10-CM | POA: Diagnosis present

## 2017-02-13 DIAGNOSIS — J111 Influenza due to unidentified influenza virus with other respiratory manifestations: Secondary | ICD-10-CM | POA: Insufficient documentation

## 2017-02-13 DIAGNOSIS — Z79899 Other long term (current) drug therapy: Secondary | ICD-10-CM | POA: Insufficient documentation

## 2017-02-13 DIAGNOSIS — R69 Illness, unspecified: Secondary | ICD-10-CM

## 2017-02-13 DIAGNOSIS — B349 Viral infection, unspecified: Secondary | ICD-10-CM | POA: Insufficient documentation

## 2017-02-13 LAB — RAPID STREP SCREEN (MED CTR MEBANE ONLY): Streptococcus, Group A Screen (Direct): NEGATIVE

## 2017-02-13 MED ORDER — ACETAMINOPHEN 325 MG PO TABS
650.0000 mg | ORAL_TABLET | Freq: Once | ORAL | Status: AC
  Administered 2017-02-13: 650 mg via ORAL
  Filled 2017-02-13: qty 2

## 2017-02-13 MED ORDER — ONDANSETRON 4 MG PO TBDP: 4.0000 mg | ORAL_TABLET | Freq: Three times a day (TID) | ORAL | 0 refills | Status: AC | PRN

## 2017-02-13 MED ORDER — LIDOCAINE VISCOUS 2 % MT SOLN: 15.0000 mL | OROMUCOSAL | 0 refills | Status: AC | PRN

## 2017-02-13 MED ORDER — OSELTAMIVIR PHOSPHATE 75 MG PO CAPS: 75.0000 mg | ORAL_CAPSULE | Freq: Two times a day (BID) | ORAL | 0 refills | Status: AC

## 2017-02-13 NOTE — ED Triage Notes (Signed)
Pt reports having a fever x 3 days. Highest 103.0 also c/o Flu like symptoms x 2 days. Pt reports taking 600mg  ibuprofen PTA today at 1430

## 2017-02-13 NOTE — ED Provider Notes (Signed)
[facility] Provider [facility]   CSN: [id number] Arrival date & time: 02/13/17  1430     History   Chief Complaint Chief Complaint  Patient presents with  . Fever    HPI Erica Wende Erica Marsh is a 47 y.o. female who presents to ED for evaluation of 1.5 day history of fever with T-max 103, generalized body aches, sore throat, dry cough and nausea.  She has been taking Tessalon Perles and using nasal spray at home with improvement in her cough and nasal congestion.  Reports sick contacts at work with similar symptoms.  She did not receive her influenza vaccine this year.  She reports improvement in her fever with ibuprofen and Tylenol.  She denies any chest pain, abdominal pain, vomiting, diarrhea, abdominal pain, lightheadedness or loss of consciousness.  HPI  Past Medical History:  Diagnosis Date  . Panic attacks     There are no active problems to display for this patient.   Past Surgical History:  Procedure Laterality Date  . CHOLECYSTECTOMY      OB History    No data available       Home Medications    Prior to Admission medications   Medication Sig Start Date End Date Taking? Authorizing Provider  Aspirin-Acetaminophen-Caffeine (GOODY HEADACHE PO) Take 1 packet by mouth once.    [provider]  buPROPion (WELLBUTRIN XL) 150 MG 24 hr tablet Take 150 mg by mouth daily.    [provider]  lidocaine (XYLOCAINE) 2 % solution Use as directed 15 mLs in the mouth or throat as needed for mouth pain. 02/13/17   Tahsin Benyo, PA-C  ondansetron (ZOFRAN ODT) 4 MG disintegrating tablet Take 1 tablet (4 mg total) by mouth every 8 (eight) hours as needed for nausea or vomiting. 02/13/17   Adriel Kessen, PA-C  oseltamivir (TAMIFLU) 75 MG capsule Take 1 capsule (75 mg total) by mouth every 12 (twelve) hours. 02/13/17   Kena Limon, PA-C  [facility] J[facility] WORT PO Take 2 tablets by mouth once.    [provider]    Family History No family  history on file.  Social History Social History   Tobacco Use  . Smoking status: Current Every Day Smoker  . Smokeless tobacco: Never Used  Substance Use Topics  . Alcohol use: No  . Drug use: No     Allergies   Penicillins   Review of Systems Review of Systems  Constitutional: Positive for appetite change, chills, fatigue and fever. Negative for activity change.  HENT: Positive for congestion, rhinorrhea and sore throat. Negative for dental problem, drooling, ear discharge, ear pain, nosebleeds, postnasal drip, sinus pressure, sinus pain and trouble swallowing.   Respiratory: Positive for cough.   Gastrointestinal: Positive for nausea. Negative for vomiting.  Skin: Negative for rash.     Physical Exam Updated Vital Signs BP 111/77 (BP Location: Left Arm)   Pulse 98   Temp (!) 101.9 F (38.8 C) (Oral)   Resp 19   Ht 5\' 3"  (1.6 m)   Wt 63.5 kg (140 lb)   SpO2 98%   BMI 24.80 kg/m   Physical Exam  Constitutional: She appears well-developed and well-nourished. No distress.  HENT:  Head: Normocephalic and atraumatic.  Right Ear: Tympanic membrane is erythematous. A middle ear effusion is present.  Left Ear: Tympanic membrane is erythematous. A middle ear effusion is present.  Nose: Mucosal edema present.  Mouth/Throat: Uvula is midline. No trismus in the jaw. No  dental abscesses or uvula swelling. Posterior oropharyngeal erythema present. No posterior oropharyngeal edema. No tonsillar exudate.  Patient does not appear to be in acute distress. No trismus or drooling present. No pooling of secretions. Patient is tolerating secretions and is not in respiratory distress. No neck pain or tenderness to palpation of the neck. Full active and passive range of motion of the neck. No evidence of RPA or PTA.  Eyes: Conjunctivae and EOM are normal. Right eye exhibits no discharge. Left eye exhibits no discharge. No scleral icterus.  Neck: Normal range of motion. Neck supple.    Cardiovascular: Regular rhythm, normal heart sounds and intact distal pulses. Tachycardia present. Exam reveals no gallop and no friction rub.  No murmur heard. Pulmonary/Chest: Effort normal and breath sounds normal. No respiratory distress.  Abdominal: Soft. Bowel sounds are normal. She exhibits no distension. There is no tenderness. There is no guarding.  Musculoskeletal: Normal range of motion. She exhibits no edema.  Neurological: She is alert. She exhibits normal muscle tone. Coordination normal.  Skin: Skin is warm and dry. No rash noted.  Psychiatric: She has a normal mood and affect.  Nursing note and vitals reviewed.    ED Treatments / Results  Labs (all labs ordered are listed, but only abnormal results are displayed) Labs Reviewed  RAPID STREP SCREEN (NOT AT [facility])  CULTURE, GROUP A STREP [facility])    EKG  EKG Interpretation None       Radiology No results found.  Procedures Procedures (including critical care time)  Medications Ordered in ED Medications  acetaminophen (TYLENOL) tablet 650 mg (650 mg Oral Given 02/13/17 1817)     Initial Impression / Assessment and Plan / ED Course  I have reviewed the triage vital signs and the nursing notes.  Pertinent labs & imaging results that were available during my care of the patient were reviewed by me and considered in my medical decision making (see chart for details).  Clinical Course as of Feb 13 1922  Wed Feb 13, 2017  1748 Fever increasing (was not given antipyretics in waiting room). Still tachycardic to 110s. Will give Tylenol, obtain strep and reassess to see if defervescence improves HR.   [HK]  1921 HR improved to 95 with antipyretics. Strep negative.  [HK]    Clinical Course User Index [HK] Dietrich Pates, PA-C    Patient presents to ED for evaluation of 1-1/2-day history of influenza-like symptoms including fever with T-max 103, generalized body aches, sore throat, dry cough and nausea.  Sick contacts  at work with similar symptoms.  Did not receive her influenza vaccine this year.  She is febrile to 102 and slightly tachycardic at low 110s.  Strep test returned as negative and she has no signs of RPA or PTA on examination of posterior oropharynx.  She is tolerating secretions with no signs of trismus, drooling.  Lungs are clear to auscultation bilaterally and there is no cough noted on examination.  Heart rate improved here with defervescence with antipyretics.  I suspect that her symptoms could be due to influenza-like illness.  She is still in the window for Tamiflu administration. After discussing risks and benefits with patient, she elects to proceed with treatment.  We will also give symptomatic treatment with lidocaine to swish and spit, Zofran as needed for nausea.  She is able to tolerate p.o. intake without difficulty here.  Patient appears stable for discharge at this time.  Strict return precautions given.  Portions of this note were  generated with Scientist, clinical (histocompatibility and immunogenetics). Dictation errors may occur despite best attempts at proofreading.   Final Clinical Impressions(s) / ED Diagnoses   Final diagnoses:  Influenza-like illness  Viral pharyngitis    ED Discharge Orders        Ordered    oseltamivir (TAMIFLU) 75 MG capsule  Every 12 hours     02/13/17 1922    ondansetron (ZOFRAN ODT) 4 MG disintegrating tablet  Every 8 hours PRN     02/13/17 1922    lidocaine (XYLOCAINE) 2 % solution  As needed     02/13/17 [address], PA-C 02/13/17 1946    Cathren Laine, MD 02/13/17 2048

## 2017-02-13 NOTE — ED Notes (Addendum)
Pt reports "pain with swallowing". Has been around someone at work with the flu.

## 2017-02-13 NOTE — Discharge Instructions (Signed)
Please read attached information regarding your condition. You appear to have an influenza-like illness.  You are still in the window for treatment with Tamiflu.  You may experience side effects with this medication such as diarrhea. Take Zofran as needed for nausea. Swish and spit lidocaine solution to help with throat discomfort.  Do not swallow. Continue Tylenol or ibuprofen as needed for body aches and fever. Follow-up with your primary care provider for further evaluation. Return to ED for worsening symptoms, trouble breathing, trouble swallowing, chest pain, increased vomiting.

## 2017-02-16 LAB — CULTURE, GROUP A STREP (THRC)

## 2017-02-28 ENCOUNTER — Emergency Department (HOSPITAL_COMMUNITY): Payer: BLUE CROSS/BLUE SHIELD

## 2017-02-28 ENCOUNTER — Emergency Department (HOSPITAL_COMMUNITY)
Admission: EM | Admit: 2017-02-28 | Discharge: 2017-02-28 | Disposition: A | Discharge status: Home / Self Care | Payer: BLUE CROSS/BLUE SHIELD | Attending: Emergency Medicine | Admitting: Emergency Medicine

## 2017-02-28 ENCOUNTER — Encounter (HOSPITAL_COMMUNITY): Payer: Self-pay | Admitting: Emergency Medicine

## 2017-02-28 ENCOUNTER — Other Ambulatory Visit: Payer: Self-pay

## 2017-02-28 DIAGNOSIS — R0789 Other chest pain: Secondary | ICD-10-CM | POA: Insufficient documentation

## 2017-02-28 DIAGNOSIS — Z79899 Other long term (current) drug therapy: Secondary | ICD-10-CM | POA: Insufficient documentation

## 2017-02-28 DIAGNOSIS — F17210 Nicotine dependence, cigarettes, uncomplicated: Secondary | ICD-10-CM | POA: Insufficient documentation

## 2017-02-28 LAB — CBC WITH DIFFERENTIAL/PLATELET
BASOS PCT: 0 %
Basophils Absolute: 0 10*3/uL (ref 0.0–0.1)
Eosinophils Absolute: 0 10*3/uL (ref 0.0–0.7)
Eosinophils Relative: 0 %
HEMATOCRIT: 40.2 % (ref 36.0–46.0)
Hemoglobin: 13.2 g/dL (ref 12.0–15.0)
Lymphocytes Relative: 42 %
Lymphs Abs: 3.4 10*3/uL (ref 0.7–4.0)
MCH: 30.8 pg (ref 26.0–34.0)
MCHC: 32.8 g/dL (ref 30.0–36.0)
MCV: 93.9 fL (ref 78.0–100.0)
MONOS PCT: 6 %
Monocytes Absolute: 0.5 10*3/uL (ref 0.1–1.0)
NEUTROS ABS: 4.2 10*3/uL (ref 1.7–7.7)
Neutrophils Relative %: 52 %
Platelets: 383 10*3/uL (ref 150–400)
RBC: 4.28 MIL/uL (ref 3.87–5.11)
RDW: 12.9 % (ref 11.5–15.5)
WBC: 8.1 10*3/uL (ref 4.0–10.5)

## 2017-02-28 LAB — I-STAT TROPONIN, ED: Troponin i, poc: 0 ng/mL (ref 0.00–0.08)

## 2017-02-28 LAB — BASIC METABOLIC PANEL
Anion gap: 11 (ref 5–15)
BUN: 8 mg/dL (ref 6–20)
CALCIUM: 9.3 mg/dL (ref 8.9–10.3)
CO2: 25 mmol/L (ref 22–32)
CREATININE: 0.91 mg/dL (ref 0.44–1.00)
Chloride: 105 mmol/L (ref 101–111)
GFR calc non Af Amer: >60 mL/min (ref >60)
Glucose, Bld: 115 mg/dL — ABNORMAL HIGH (ref 65–99)
Potassium: 3.7 mmol/L (ref 3.5–5.1)
Sodium: 141 mmol/L (ref 135–145)

## 2017-02-28 MED ORDER — IBUPROFEN 400 MG PO TABS
600.0000 mg | ORAL_TABLET | Freq: Once | ORAL | Status: AC
  Administered 2017-02-28: 600 mg via ORAL
  Filled 2017-02-28: qty 1

## 2017-02-28 MED ORDER — CYCLOBENZAPRINE HCL 10 MG PO TABS: 10.0000 mg | ORAL_TABLET | Freq: Two times a day (BID) | ORAL | 0 refills | Status: AC | PRN

## 2017-02-28 NOTE — ED Notes (Signed)
ED Provider at bedside. 

## 2017-02-28 NOTE — ED Provider Notes (Addendum)
[facility] EMERGENCY DEPARTMENT Provider Note   CSN: [id number] Arrival date & time: 02/28/17  1431     History   Chief Complaint Chief Complaint  Patient presents with  . Chest Pain    HPI Erica Marsh is a 47 y.o. female  without significant PMHx, presenting to the ED with about 1 week of left sided chest pain. Pt reports she recently had the flu and sx had resolved, however she began having left sided chest pain. Pain is described as sharp, worse with movement, palpation, breathing and raising arms. States she feels like she is SOB because she can't take a deep breath without the pain. Denies hx of DVT/PE, recent travel, recent surgery or trauma, exogenous estrogen use, hemoptysis. No cardiac hx.  The history is provided by the patient.    Past Medical History:  Diagnosis Date  . Panic attacks     There are no active problems to display for this patient.   Past Surgical History:  Procedure Laterality Date  . CHOLECYSTECTOMY      OB History    No data available       Home Medications    Prior to Admission medications   Medication Sig Start Date End Date Taking? Authorizing Provider  Aspirin-Acetaminophen-Caffeine (GOODY HEADACHE PO) Take 1 packet by mouth once.    [provider]  buPROPion (WELLBUTRIN XL) 150 MG 24 hr tablet Take 150 mg by mouth daily.    [provider]  cyclobenzaprine (FLEXERIL) 10 MG tablet Take 1 tablet (10 mg total) by mouth 2 (two) times daily as needed for muscle spasms. 02/28/17   Robinson, [place] N, PA-C  lidocaine (XYLOCAINE) 2 % solution Use as directed 15 mLs in the mouth or throat as needed for mouth pain. 02/13/17   Khatri, Hina, PA-C  ondansetron (ZOFRAN ODT) 4 MG disintegrating tablet Take 1 tablet (4 mg total) by mouth every 8 (eight) hours as needed for nausea or vomiting. 02/13/17   Khatri, Hina, PA-C  oseltamivir (TAMIFLU) 75 MG capsule Take 1 capsule (75 mg total) by mouth every 12 (twelve)  hours. 02/13/17   Khatri, Hina, PA-C  [facility] WORT PO Take 2 tablets by mouth once.    [provider]    Family History History reviewed. No pertinent family history.  Social History Social History   Tobacco Use  . Smoking status: Current Every Day Smoker  . Smokeless tobacco: Never Used  Substance Use Topics  . Alcohol use: No  . Drug use: No     Allergies   Penicillins   Review of Systems Review of Systems  Constitutional: Negative for diaphoresis and fever.  Cardiovascular: Positive for chest pain. Negative for palpitations and leg swelling.  Gastrointestinal: Negative for nausea.  All other systems reviewed and are negative.    Physical Exam Updated Vital Signs BP 106/68   Pulse 79   Temp 98.2 F (36.8 C) (Oral)   Resp 19   SpO2 100%   Physical Exam  Constitutional: She appears well-developed and well-nourished. No distress.  HENT:  Head: Normocephalic and atraumatic.  Mouth/Throat: Oropharynx is clear and moist.  Eyes: Conjunctivae are normal.  Neck: Normal range of motion. Neck supple.  Cardiovascular: Normal rate, regular rhythm, normal heart sounds and intact distal pulses.  Pulmonary/Chest: Effort normal and breath sounds normal. No stridor. No respiratory distress. She has no wheezes. She has no rales. She exhibits tenderness (left anterior chest wall).  Abdominal: Soft. Bowel sounds  are normal. She exhibits no distension. There is no tenderness. There is no rebound and no guarding.  Musculoskeletal:  No LE edema, no palpable cords, no tenderness  Neurological: She is alert.  Skin: Skin is warm. She is not diaphoretic.  Psychiatric: She has a normal mood and affect. Her behavior is normal.  Nursing note and vitals reviewed.    ED Treatments / Results  Labs (all labs ordered are listed, but only abnormal results are displayed) Labs Reviewed  BASIC METABOLIC PANEL - Abnormal; Notable for the following components:      Result Value    Glucose, Bld 115 (*)    All other components within normal limits  CBC WITH DIFFERENTIAL/PLATELET  I-STAT TROPONIN, ED    EKG  EKG Interpretation  Date/Time:  Thursday February 28 2017 14:37:24 EST Ventricular Rate:  94 PR Interval:  130 QRS Duration: 78 QT Interval:  354 QTC Calculation: 442 R Axis:   83 Text Interpretation:  Normal sinus rhythm Possible Left atrial enlargement Borderline ECG since last tracing no significant change Confirmed by Mancel BaleWentz, Elliott [phone]) on 02/28/2017 10:20:16 PM       Radiology Dg Chest 2 View  Result Date: 02/28/2017 CLINICAL DATA:  Chest pain, shortness of breath and cough. EXAM: CHEST  2 VIEW COMPARISON:  None FINDINGS: The heart size and mediastinal contours are within normal limits. Both lungs are clear. The visualized skeletal structures are unremarkable. IMPRESSION: No active cardiopulmonary disease. Electronically Signed   By: Signa Kellaylor  Stroud M.D.   On: 02/28/2017 15:25    Procedures Procedures (including critical care time)  Medications Ordered in ED Medications  ibuprofen (ADVIL,MOTRIN) tablet 600 mg (not administered)     Initial Impression / Assessment and Plan / ED Course  I have reviewed the triage vital signs and the nursing notes.  Pertinent labs & imaging results that were available during my care of the patient were reviewed by me and considered in my medical decision making (see chart for details).     Pt presenting with left sided CP, suspect musculoskeletal in nature. Chest pain is not likely of cardiac or pulmonary etiology d/t presentation, PERC negative, VSS, no tracheal deviation, no JVD or new murmur, RRR, breath sounds equal bilaterally, EKG without acute abnormalities, negative troponin, and negative CXR. Low heart score. Patient is to be discharged with recommendation to follow up with PCP in regards to today's hospital visit. Pt has been advised to return to the ED if CP becomes exertional, associated with  diaphoresis or nausea, radiates to left jaw/arm, worsens or becomes concerning in any way. Pt appears reliable for follow up and is agreeable to discharge.   Discussed results, findings, treatment and follow up. Patient advised of return precautions. Patient verbalized understanding and agreed with plan.  Final Clinical Impressions(s) / ED Diagnoses   Final diagnoses:  Chest wall pain    ED Discharge Orders        Ordered    cyclobenzaprine (FLEXERIL) 10 MG tablet  2 times daily PRN     02/28/17 2125       Robinson, [place] N, PA-C 02/28/17 2143    Robinson, [place] N, PA-C 02/28/17 2221    Mancel BaleWentz, Elliott, MD 03/01/17 1010

## 2017-02-28 NOTE — ED Provider Notes (Signed)
Patient placed in Quick Look pathway, seen and evaluated   Chief Complaint: left sided chest pain  HPI:   Pt presenting with about 1 week of left sided chest pain. Pt reports she recently had the flu and sx had resolved, however she began having left sided chest pain. Pain is described as sharp, worse with movement, breathing and raising arms. States she feels like she is SOB because she can't take a deep breath without the pain. Denies hx of DVT/PE, recent travel, recent surgery or trauma, exogenous estrogen use, hemoptysis.   ROS: + chest pain (one)  Physical Exam:   Gen: No distress, well-appearing.  Neuro: Awake and Alert  Skin: Warm    Focused Exam: No inc work of breathing. Lungs CTAB. Heart sounds normal. No LE edema or tenderness.  PERC neg.   Initiation of care has begun. The patient has been counseled on the process, plan, and necessity for staying for the completion/evaluation, and the remainder of the medical screening examination    Erica Marsh, [place] N, PA-C 02/28/17 1521    Mancel BaleWentz, Elliott, MD 03/01/17 1010

## 2017-02-28 NOTE — Discharge Instructions (Addendum)
Please read instructions below. Apply ice or heat to your chest for pain relief. You can take advil/ibuprofen every 6 hours as needed for pain. You can take flexeril at bedtime as needed for muscle spasm. Schedule an appointment with your primary care if symptoms persist. Return to the ER for new or worsening symptoms; including worsening chest pain, shortness of breath, pain that radiates to the arm or neck, pain or shortness of breath worsened with exertion.

## 2017-02-28 NOTE — ED Triage Notes (Signed)
Pt arrives via POV from home with left sided chest pain and SOB. States recently treated for flu. Pt reports pain radiates to the back. VSS.

## 2019-06-25 IMAGING — DX DG CHEST 2V
2 series · 2 of 2 positions shown · non-contrast
Comparison: None

CLINICAL DATA: Chest pain, shortness of breath and cough.

EXAM:
CHEST  2 VIEW

[w chest pa]
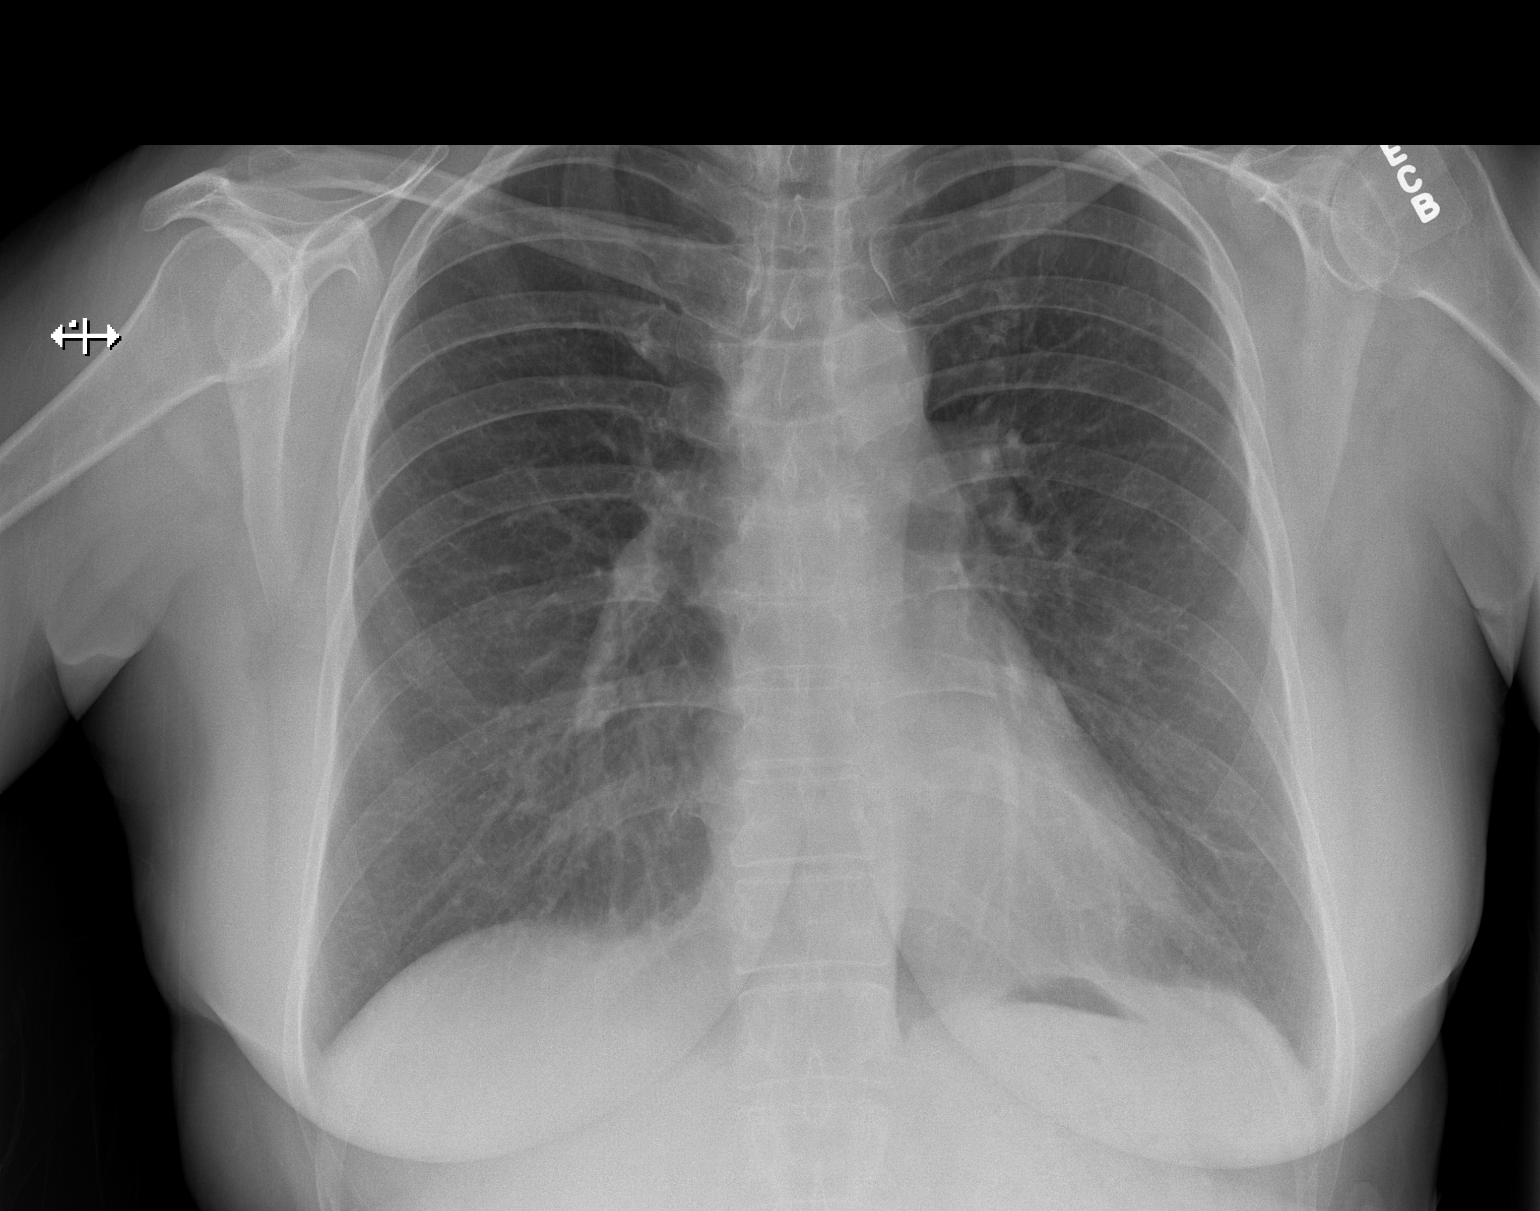

[w chest lat]
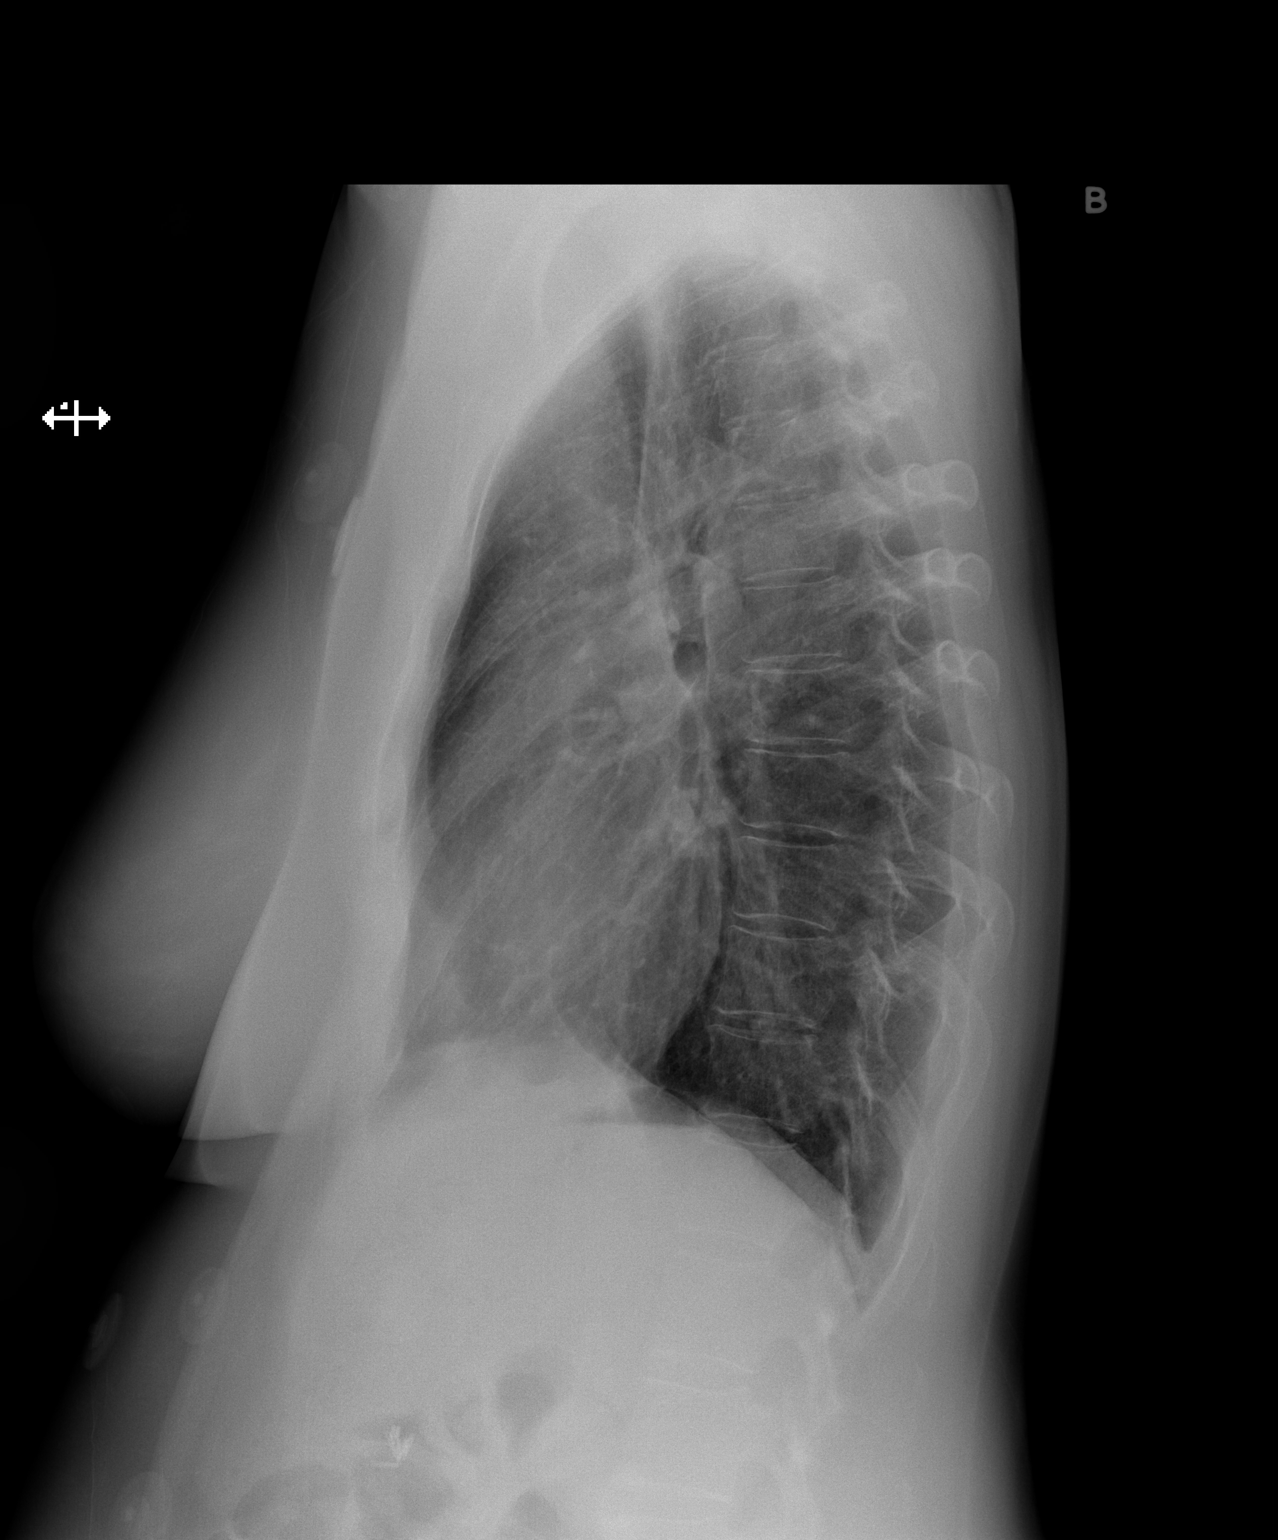

[2 of 2 positions shown; findings below may reference images not displayed]

FINDINGS: The heart size and mediastinal contours are within normal limits.
Both lungs are clear. The visualized skeletal structures are
unremarkable.
IMPRESSION: No active cardiopulmonary disease.
